# Patient Record
Sex: Male | Born: 1995 | Race: Black or African American | Hispanic: No | Marital: Single | Smoking: Never smoker
Health system: Southern US, Community
[De-identification: ages and names within clinical notes are randomized; demographics above are authoritative.]

---
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
## 2015-08-24 ENCOUNTER — Emergency Department (HOSPITAL_COMMUNITY)
Admission: EM | Admit: 2015-08-24 | Discharge: 2015-08-24 | Disposition: A | Discharge status: Home / Self Care | Payer: BC Managed Care – PPO | Source: Home / Self Care

## 2018-07-07 ENCOUNTER — Ambulatory Visit
Admission: EM | Admit: 2018-07-07 | Discharge: 2018-07-07 | Disposition: A | Discharge status: Home / Self Care | Payer: BC Managed Care – PPO | Attending: Emergency Medicine | Admitting: Emergency Medicine

## 2018-07-07 ENCOUNTER — Ambulatory Visit: Payer: BC Managed Care – PPO

## 2018-07-07 ENCOUNTER — Ambulatory Visit (HOSPITAL_COMMUNITY)
Admission: EM | Admit: 2018-07-07 | Discharge: 2018-07-07 | Discharge status: Against Medical Advice | Payer: BC Managed Care – PPO

## 2018-07-07 ENCOUNTER — Encounter: Payer: Self-pay | Admitting: Emergency Medicine

## 2018-07-07 DIAGNOSIS — R0981 Nasal congestion: Secondary | ICD-10-CM

## 2018-07-07 DIAGNOSIS — J3489 Other specified disorders of nose and nasal sinuses: Secondary | ICD-10-CM

## 2018-07-07 DIAGNOSIS — R509 Fever, unspecified: Secondary | ICD-10-CM | POA: Diagnosis not present

## 2018-07-07 DIAGNOSIS — R5383 Other fatigue: Secondary | ICD-10-CM

## 2018-07-07 DIAGNOSIS — J111 Influenza due to unidentified influenza virus with other respiratory manifestations: Secondary | ICD-10-CM

## 2018-07-07 DIAGNOSIS — R69 Illness, unspecified: Secondary | ICD-10-CM | POA: Insufficient documentation

## 2018-07-07 MED ORDER — CETIRIZINE HCL 10 MG PO CAPS: 10.0000 mg | ORAL_CAPSULE | Freq: Every day | ORAL | 0 refills | Status: AC

## 2018-07-07 MED ORDER — PSEUDOEPH-BROMPHEN-DM 30-2-10 MG/5ML PO SYRP: 5.0000 mL | ORAL_SOLUTION | Freq: Four times a day (QID) | ORAL | 0 refills | Status: AC | PRN

## 2018-07-07 MED ORDER — FLUTICASONE PROPIONATE 50 MCG/ACT NA SUSP: 1.0000 | Freq: Every day | NASAL | 0 refills | Status: AC

## 2018-07-07 MED ORDER — IBUPROFEN 600 MG PO TABS: 600.0000 mg | ORAL_TABLET | Freq: Four times a day (QID) | ORAL | 0 refills | Status: AC | PRN

## 2018-07-07 NOTE — ED Triage Notes (Signed)
PT presents to [facility] for assessment of 3 days of cough, congestion, headaches, fevers.

## 2018-07-07 NOTE — ED Notes (Signed)
Patient able to ambulate independently  

## 2018-07-07 NOTE — ED Triage Notes (Signed)
Per pt access, pt lwbs 

## 2018-07-07 NOTE — Discharge Instructions (Signed)
No pneumonia on your chest x-ray, the radiologist will read this as well and if this comes back different I will call you and send in any medicines needed Your symptoms are concerning for flu The flu is a virus and typically last approximately 1 week Please begin daily cetirizine and Flonase nasal spray to help with congestion and drainage Please use cough syrup as needed for cough Tylenol and ibuprofen as needed for body aches, fever  Please follow-up if symptoms not resolving, worsening, developing fever persisting into midweek, shortness of breath, difficulty breathing

## 2018-07-07 NOTE — ED Provider Notes (Signed)
[facility]    CSN: [id number] Arrival date & time: 07/07/18  1441     History   Chief Complaint Chief Complaint  Patient presents with  . Flu-Like Symptoms    HPI KELLIN TRIMBACH is a 23 y.o. male no significant past medical history, Patient is presenting with URI symptoms- congestion, cough, denies sore throat.  Patient has also had extremely low energy, body aches.  Noted hot and cold chills.  Subjective fevers.  Patient's main complaints are overall feeling poor. Symptoms have been going on for 3 days. Patient has tried Tylenol, with minimal relief. Denies nausea, vomiting, diarrhea. Denies shortness of breath and chest pain.    HPI  History reviewed. No pertinent past medical history.  There are no active problems to display for this patient.   History reviewed. No pertinent surgical history.     Home Medications    Prior to Admission medications   Medication Sig Start Date End Date Taking? Authorizing Provider  brompheniramine-pseudoephedrine-DM 30-2-10 MG/5ML syrup Take 5 mLs by mouth 4 (four) times daily as needed. 07/07/18   Marcianne Ozbun C, PA-C  Cetirizine HCl 10 MG CAPS Take 1 capsule (10 mg total) by mouth daily for 10 days. 07/07/18 07/17/18  Katti Pelle C, PA-C  fluticasone (FLONASE) 50 MCG/ACT nasal spray Place 1-2 sprays into both nostrils daily. 07/07/18   Zenas Santa C, PA-C  ibuprofen (ADVIL,MOTRIN) 600 MG tablet Take 1 tablet (600 mg total) by mouth every 6 (six) hours as needed. 07/07/18   Janann Boeve, Junius Creamer, PA-C    Family History History reviewed. No pertinent family history.  Social History Social History   Tobacco Use  . Smoking status: Never Smoker  . Smokeless tobacco: Never Used  Substance Use Topics  . Alcohol use: Not on file  . Drug use: Not on file     Allergies   Patient has no known allergies.   Review of Systems Review of Systems  Constitutional: Positive for appetite change, chills, fatigue and  fever. Negative for activity change.  HENT: Positive for congestion and rhinorrhea. Negative for ear pain, sinus pressure, sore throat and trouble swallowing.   Eyes: Negative for discharge and redness.  Respiratory: Positive for cough. Negative for chest tightness and shortness of breath.   Cardiovascular: Negative for chest pain.  Gastrointestinal: Negative for abdominal pain, diarrhea, nausea and vomiting.  Musculoskeletal: Positive for myalgias.  Skin: Negative for rash.  Neurological: Positive for headaches. Negative for dizziness and light-headedness.     Physical Exam Triage Vital Signs ED Triage Vitals  Enc Vitals Group     BP 07/07/18 1453 118/71     Pulse Rate 07/07/18 1453 98     Resp 07/07/18 1453 16     Temp 07/07/18 1453 98.8 F (37.1 C)     Temp Source 07/07/18 1453 Oral     SpO2 07/07/18 1453 93 %     Weight --      Height --      Head Circumference --      Peak Flow --      Pain Score 07/07/18 1455 0     Pain Loc --      Pain Edu? --      Excl. in GC? --    No data found.  Updated Vital Signs BP 118/71 (BP Location: Left Arm)   Pulse 98   Temp 98.8 F (37.1 C) (Oral)   Resp 16   SpO2 93%   Visual Acuity  Right Eye Distance:   Left Eye Distance:   Bilateral Distance:    Right Eye Near:   Left Eye Near:    Bilateral Near:     Physical Exam Vitals signs and nursing note reviewed.  Constitutional:      Appearance: He is well-developed.  HENT:     Head: Normocephalic and atraumatic.     Ears:     Comments: Bilateral ears without tenderness to palpation of external auricle, tragus and mastoid, EAC's without erythema or swelling, TM's with good bony landmarks and cone of light. Non erythematous.     Nose:     Comments: Nasal mucosa erythematous    Mouth/Throat:     Comments: Oral mucosa pink and moist, no tonsillar enlargement or exudate. Posterior pharynx patent and erythematous, no uvula deviation or swelling. Normal phonation.  Eyes:      Conjunctiva/sclera: Conjunctivae normal.  Neck:     Musculoskeletal: Neck supple.  Cardiovascular:     Rate and Rhythm: Normal rate and regular rhythm.     Heart sounds: No murmur.  Pulmonary:     Effort: Pulmonary effort is normal. No respiratory distress.     Breath sounds: Normal breath sounds.     Comments: Breathing comfortably at rest, CTABL, no wheezing, rales or other adventitious sounds auscultated Abdominal:     Palpations: Abdomen is soft.     Tenderness: There is no abdominal tenderness.  Skin:    General: Skin is warm and dry.  Neurological:     Mental Status: He is alert.      UC Treatments / Results  Labs (all labs ordered are listed, but only abnormal results are displayed) Labs Reviewed - No data to display  EKG None  Radiology Dg Chest 2 View  Result Date: 07/07/2018 CLINICAL DATA:  Productive cough with sputum EXAM: CHEST - 2 VIEW COMPARISON:  August 10, 2010 FINDINGS: The heart size and mediastinal contours are within normal limits. Both lungs are clear. The visualized skeletal structures are unremarkable. IMPRESSION: No active cardiopulmonary disease. Electronically Signed   By: Sherian ReinWei-Chen  Lin M.D.   On: 07/07/2018 15:38    Procedures Procedures (including critical care time)  Medications Ordered in UC Medications - No data to display  Initial Impression / Assessment and Plan / UC Course  I have reviewed the triage vital signs and the nursing notes.  Pertinent labs & imaging results that were available during my care of the patient were reviewed by me and considered in my medical decision making (see chart for details).     Chest x-ray obtained given oxygen maxing at 94%, patient young and healthy and would have expected a higher O2.  Chest x-ray negative for pneumonia.  Most likely viral illness, likely influenza.  Patient outside of Tamiflu window.  Will recommend symptomatic and supportive care.  Recommendations below.  Continue to monitor  temperature, breathing and symptoms,Discussed strict return precautions. Patient verbalized understanding and is agreeable with plan.  Final Clinical Impressions(s) / UC Diagnoses   Final diagnoses:  Influenza-like illness     Discharge Instructions     No pneumonia on your chest x-ray, the radiologist will read this as well and if this comes back different I will call you and send in any medicines needed Your symptoms are concerning for flu The flu is a virus and typically last approximately 1 week Please begin daily cetirizine and Flonase nasal spray to help with congestion and drainage Please use cough syrup as needed for  cough Tylenol and ibuprofen as needed for body aches, fever  Please follow-up if symptoms not resolving, worsening, developing fever persisting into midweek, shortness of breath, difficulty breathing   ED Prescriptions    Medication Sig Dispense Auth. Provider   ibuprofen (ADVIL,MOTRIN) 600 MG tablet Take 1 tablet (600 mg total) by mouth every 6 (six) hours as needed. 30 tablet Jaziel Bennett C, PA-C   Cetirizine HCl 10 MG CAPS Take 1 capsule (10 mg total) by mouth daily for 10 days. 10 capsule Malaia Buchta C, PA-C   fluticasone (FLONASE) 50 MCG/ACT nasal spray Place 1-2 sprays into both nostrils daily. 1 g Zackrey Dyar C, PA-C   brompheniramine-pseudoephedrine-DM 30-2-10 MG/5ML syrup Take 5 mLs by mouth 4 (four) times daily as needed. 120 mL Ajene Carchi C, PA-C     Controlled Substance Prescriptions [place] Controlled Substance Registry consulted? Not Applicable   Lew DawesWieters, Macrina Lehnert C, [place]PA-C 07/07/18 1657

## 2019-07-05 IMAGING — DX DG CHEST 2V
2 series · 2 of 2 positions shown · non-contrast
Comparison: August 10, 2010

CLINICAL DATA: Productive cough with sputum

EXAM:
CHEST - 2 VIEW

[chest pa]
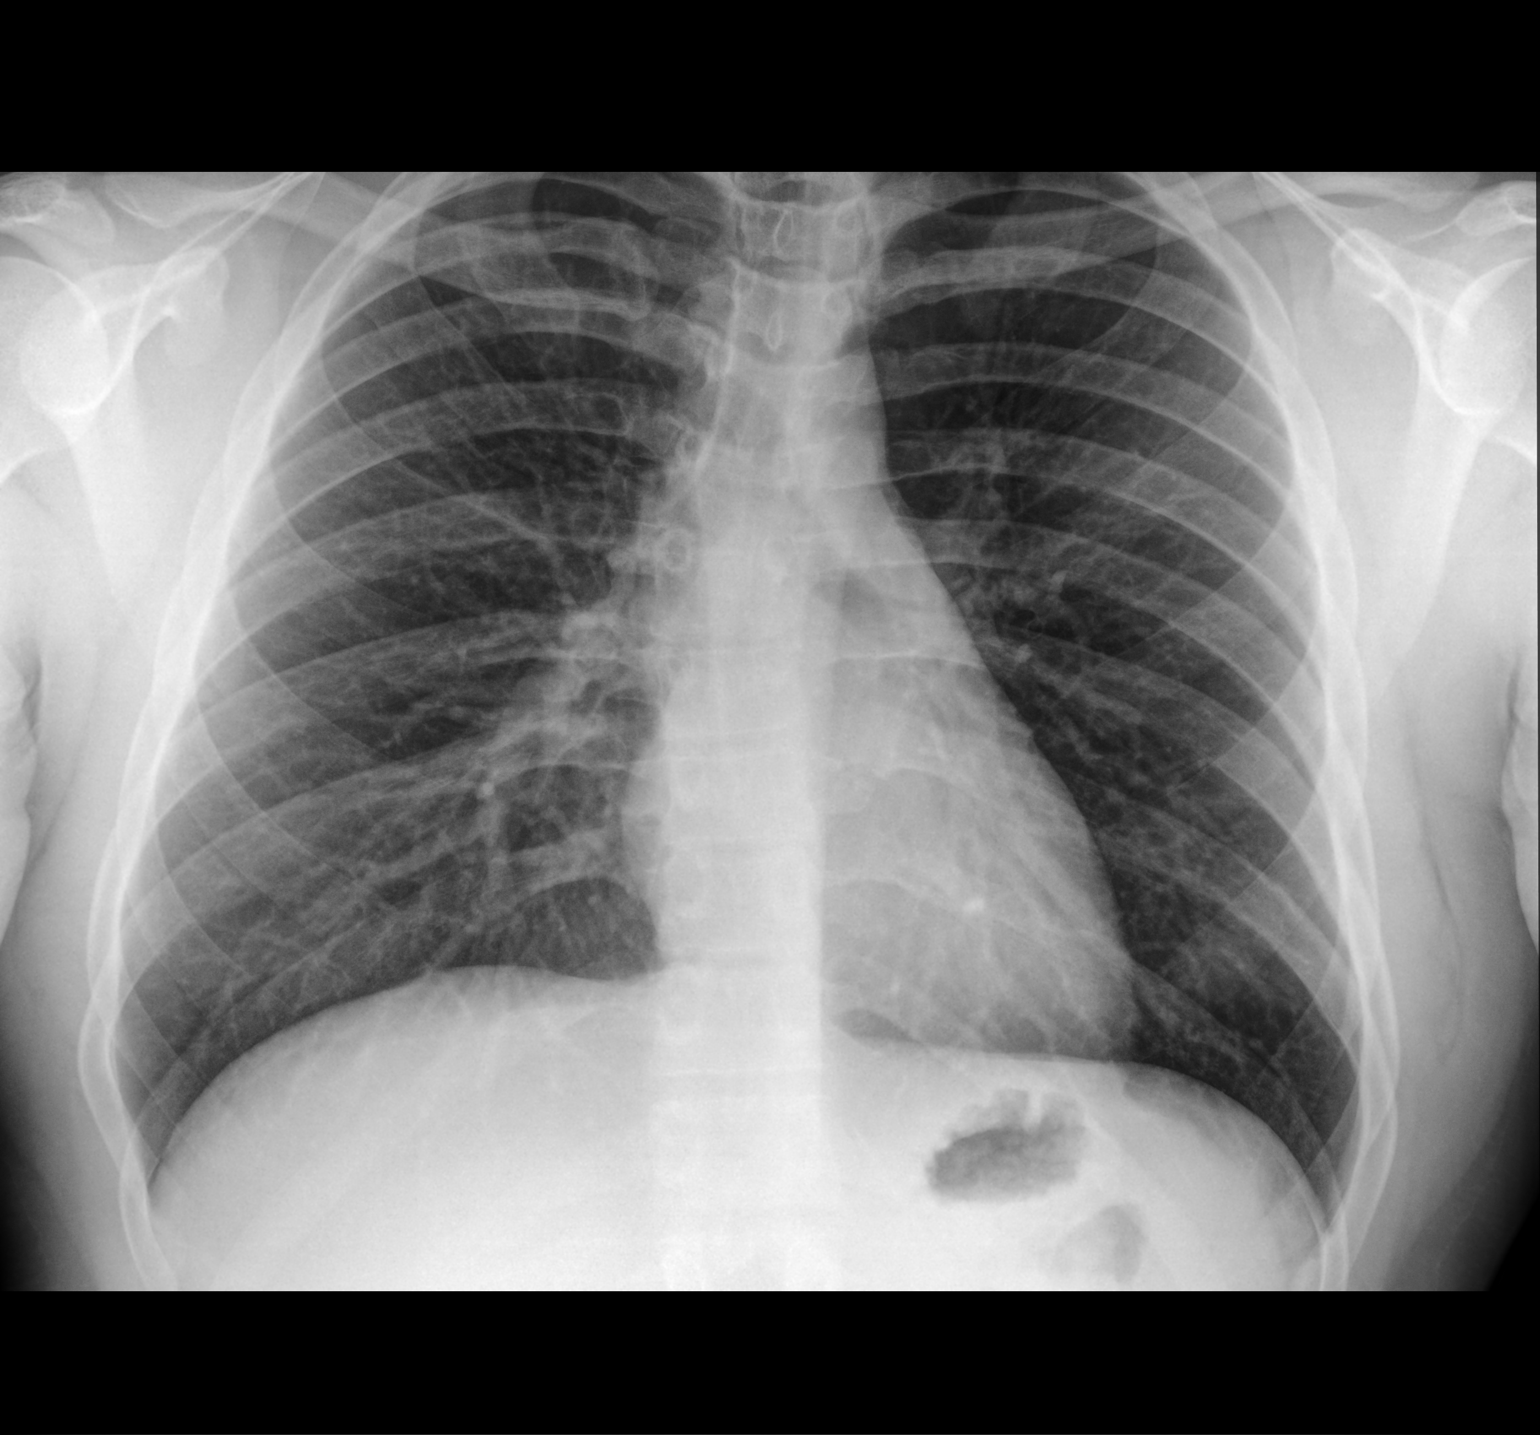

[chest lat]
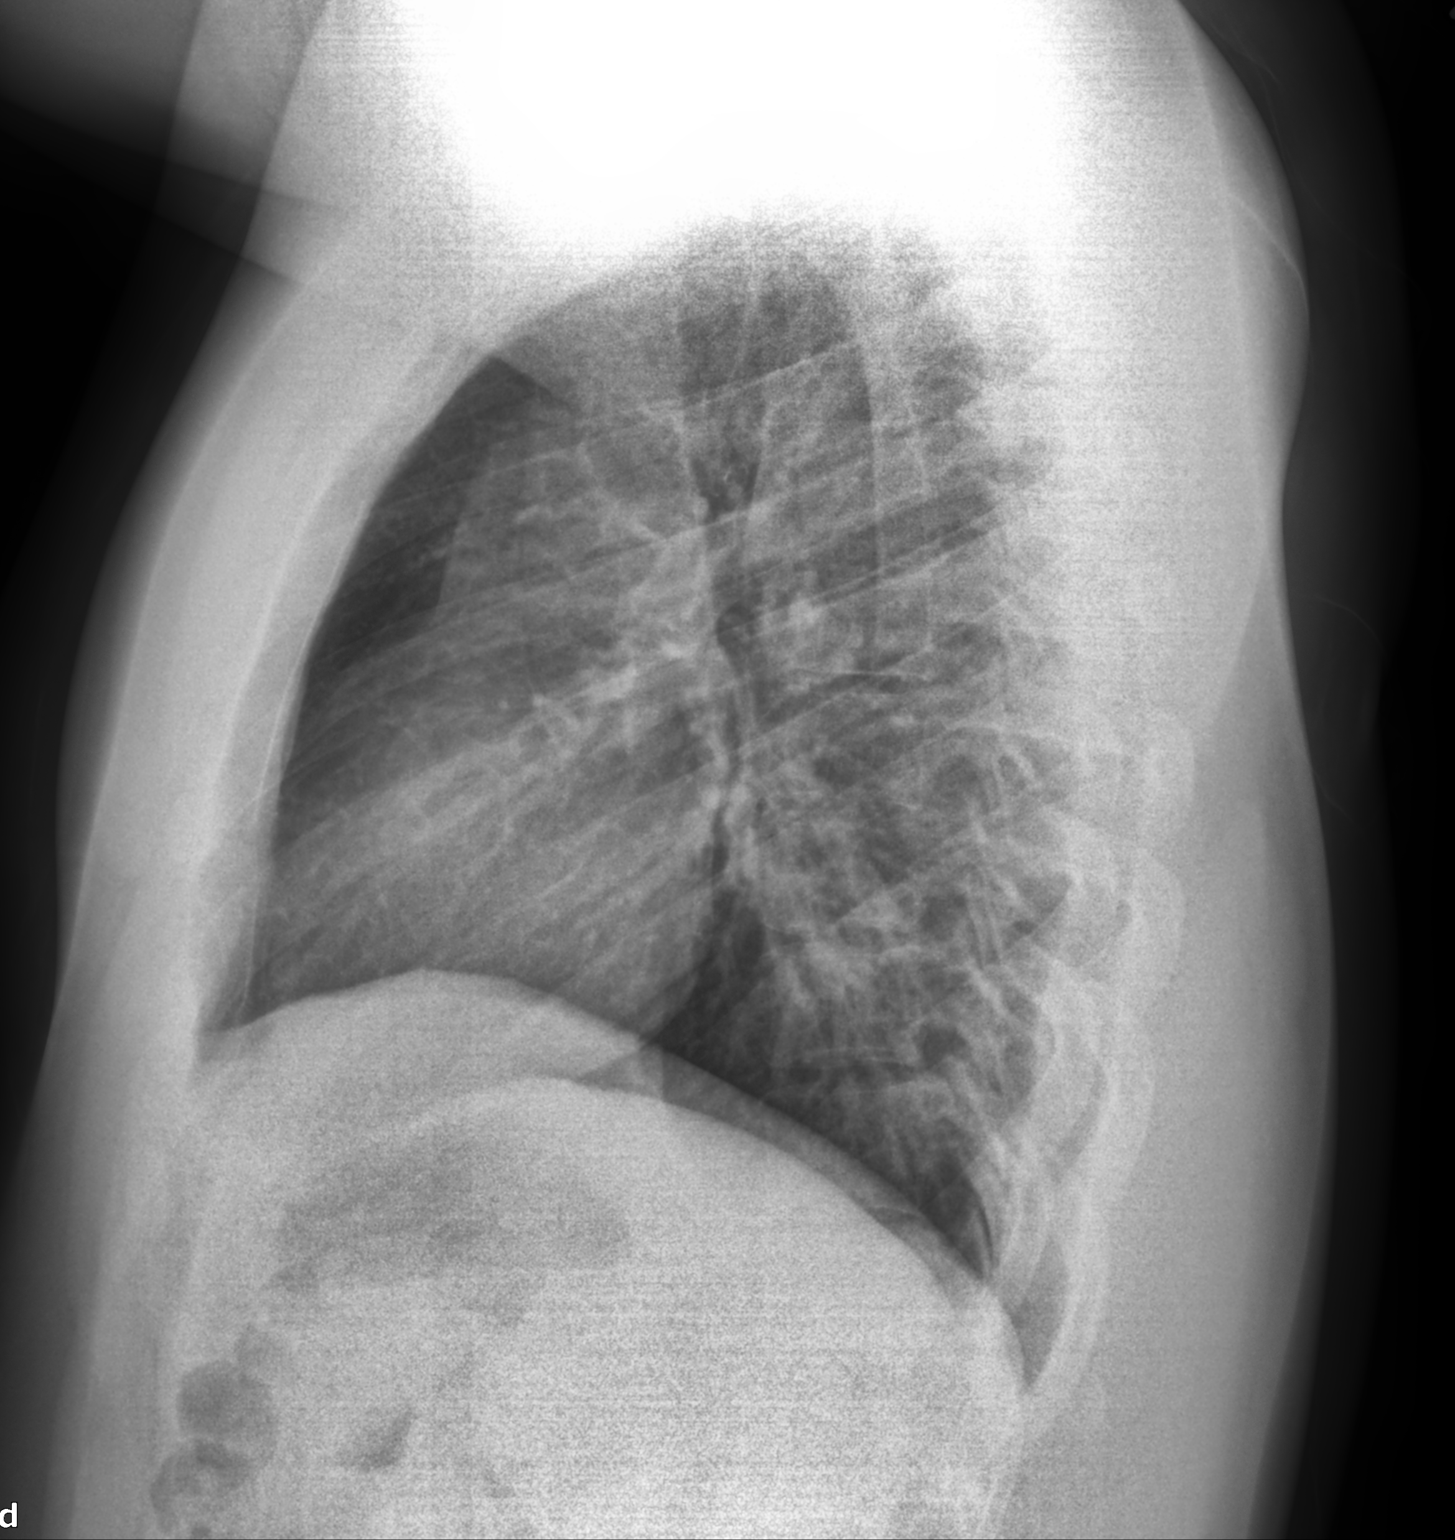

[2 of 2 positions shown; findings below may reference images not displayed]

FINDINGS: The heart size and mediastinal contours are within normal limits.
Both lungs are clear. The visualized skeletal structures are
unremarkable.
IMPRESSION: No active cardiopulmonary disease.

## 2019-10-19 ENCOUNTER — Ambulatory Visit: Payer: BC Managed Care – PPO | Attending: Internal Medicine

## 2019-10-19 DIAGNOSIS — Z23 Encounter for immunization: Secondary | ICD-10-CM

## 2019-10-19 NOTE — Progress Notes (Signed)
   Covid-19 Vaccination Clinic  Name:  MARKCUS LAZENBY    MRN: [id number] DOB: Oct 22, 1995  10/19/2019  Mr. Gabriel was observed post Covid-19 immunization for 15 minutes without incident. He was provided with Vaccine Information Sheet and instruction to access the V-Safe system.   Mr. Grewe was instructed to call 911 with any severe reactions post vaccine: Marland Kitchen Difficulty breathing  . Swelling of face and throat  . A fast heartbeat  . A bad rash all over body  . Dizziness and weakness   Immunizations Administered    Name Date Dose VIS Date Route   Pfizer COVID-19 Vaccine 10/19/2019 11:51 AM 0.3 mL 08/14/2018 Intramuscular   Manufacturer: [employer], [employer]   Lot: [id number]   NDC: [address]-1

## 2019-11-11 ENCOUNTER — Ambulatory Visit: Payer: BC Managed Care – PPO | Attending: Internal Medicine

## 2019-11-11 DIAGNOSIS — Z23 Encounter for immunization: Secondary | ICD-10-CM

## 2019-11-11 NOTE — Progress Notes (Signed)
   Covid-19 Vaccination Clinic  Name:  Michael Gaines    MRN: [id number] DOB: March 29, 1996  11/11/2019  [name] was observed post Covid-19 immunization for 15 minutes without incident. He was provided with Vaccine Information Sheet and instruction to access the V-Safe system.   [name] was instructed to call 911 with any severe reactions post vaccine: Marland Kitchen Difficulty breathing  . Swelling of face and throat  . A fast heartbeat  . A bad rash all over body  . Dizziness and weakness   Immunizations Administered    Name Date Dose VIS Date Route   Pfizer COVID-19 Vaccine 11/11/2019 11:24 AM 0.3 mL 08/14/2018 Intramuscular   Manufacturer: [employer], [employer]   Lot: [id number]   NDC: [address]-6
